# Patient Record
Sex: Female | Born: 1970 | Race: White | Hispanic: No | Marital: Married | Smoking: Never smoker
Health system: Southern US, Community
[De-identification: ages and names within clinical notes are randomized; demographics above are authoritative.]

## PROBLEM LIST (undated history)

## (undated) DIAGNOSIS — J45909 Unspecified asthma, uncomplicated: Secondary | ICD-10-CM

## (undated) DIAGNOSIS — K2100 Gastro-esophageal reflux disease with esophagitis, without bleeding: Secondary | ICD-10-CM

## (undated) DIAGNOSIS — R7303 Prediabetes: Secondary | ICD-10-CM

## (undated) DIAGNOSIS — K589 Irritable bowel syndrome without diarrhea: Secondary | ICD-10-CM

## (undated) DIAGNOSIS — I10 Essential (primary) hypertension: Secondary | ICD-10-CM

## (undated) DIAGNOSIS — T7840XA Allergy, unspecified, initial encounter: Secondary | ICD-10-CM

## (undated) DIAGNOSIS — F419 Anxiety disorder, unspecified: Secondary | ICD-10-CM

## (undated) DIAGNOSIS — D649 Anemia, unspecified: Secondary | ICD-10-CM

## (undated) DIAGNOSIS — M549 Dorsalgia, unspecified: Secondary | ICD-10-CM

## (undated) DIAGNOSIS — Q799 Congenital malformation of musculoskeletal system, unspecified: Secondary | ICD-10-CM

## (undated) DIAGNOSIS — G8929 Other chronic pain: Secondary | ICD-10-CM

## (undated) DIAGNOSIS — G43909 Migraine, unspecified, not intractable, without status migrainosus: Secondary | ICD-10-CM

## (undated) DIAGNOSIS — L900 Lichen sclerosus et atrophicus: Secondary | ICD-10-CM

## (undated) DIAGNOSIS — M1990 Unspecified osteoarthritis, unspecified site: Secondary | ICD-10-CM

## (undated) HISTORY — DX: Congenital malformation of musculoskeletal system, unspecified: Q79.9

## (undated) HISTORY — PX: OTHER SURGICAL HISTORY: SHX169

## (undated) HISTORY — DX: Essential (primary) hypertension: I10

## (undated) HISTORY — PX: COLONOSCOPY: SHX174

## (undated) HISTORY — PX: LEG SURGERY: SHX1003

## (undated) HISTORY — DX: Migraine, unspecified, not intractable, without status migrainosus: G43.909

## (undated) HISTORY — PX: ESOPHAGOGASTRODUODENOSCOPY: SHX1529

## (undated) HISTORY — DX: Unspecified asthma, uncomplicated: J45.909

## (undated) HISTORY — PX: NASAL SINUS SURGERY: SHX719

## (undated) HISTORY — DX: Gastro-esophageal reflux disease with esophagitis: K21.0

## (undated) HISTORY — PX: ABLATION: SHX5711

## (undated) HISTORY — DX: Lichen sclerosus et atrophicus: L90.0

## (undated) HISTORY — PX: ENDOMETRIAL ABLATION: SHX621

## (undated) HISTORY — DX: Gastro-esophageal reflux disease with esophagitis, without bleeding: K21.00

---

## 2003-04-15 ENCOUNTER — Encounter: Payer: Self-pay | Admitting: Neurological Surgery

## 2003-04-15 ENCOUNTER — Ambulatory Visit (HOSPITAL_COMMUNITY): Admission: RE | Admit: 2003-04-15 | Discharge: 2003-04-16 | Payer: Self-pay | Admitting: Neurological Surgery

## 2003-08-12 ENCOUNTER — Observation Stay (HOSPITAL_COMMUNITY): Admission: RE | Admit: 2003-08-12 | Discharge: 2003-08-12 | Payer: Self-pay | Admitting: Neurological Surgery

## 2003-10-07 ENCOUNTER — Inpatient Hospital Stay (HOSPITAL_COMMUNITY): Admission: RE | Admit: 2003-10-07 | Discharge: 2003-10-09 | Payer: Self-pay | Admitting: Neurological Surgery

## 2003-10-10 ENCOUNTER — Emergency Department (HOSPITAL_COMMUNITY): Admission: EM | Admit: 2003-10-10 | Discharge: 2003-10-11 | Payer: Self-pay | Admitting: *Deleted

## 2003-10-11 ENCOUNTER — Observation Stay (HOSPITAL_COMMUNITY): Admission: AD | Admit: 2003-10-11 | Discharge: 2003-10-12 | Payer: Self-pay | Admitting: Neurological Surgery

## 2004-03-07 ENCOUNTER — Encounter: Admission: RE | Admit: 2004-03-07 | Discharge: 2004-03-07 | Payer: Self-pay | Admitting: Neurological Surgery

## 2004-06-26 ENCOUNTER — Encounter: Admission: RE | Admit: 2004-06-26 | Discharge: 2004-06-26 | Payer: Self-pay | Admitting: Neurological Surgery

## 2004-10-10 ENCOUNTER — Encounter: Admission: RE | Admit: 2004-10-10 | Discharge: 2004-10-10 | Payer: Self-pay | Admitting: Neurological Surgery

## 2004-11-10 ENCOUNTER — Ambulatory Visit: Payer: Self-pay | Admitting: Internal Medicine

## 2004-12-26 ENCOUNTER — Ambulatory Visit: Payer: Self-pay

## 2005-12-07 ENCOUNTER — Ambulatory Visit: Payer: Self-pay | Admitting: Internal Medicine

## 2006-11-18 ENCOUNTER — Ambulatory Visit: Payer: Self-pay | Admitting: Unknown Physician Specialty

## 2007-06-14 ENCOUNTER — Emergency Department: Payer: Self-pay | Admitting: Emergency Medicine

## 2007-08-19 ENCOUNTER — Ambulatory Visit: Payer: Self-pay | Admitting: Internal Medicine

## 2008-11-12 ENCOUNTER — Ambulatory Visit: Payer: Self-pay | Admitting: Internal Medicine

## 2009-11-29 ENCOUNTER — Ambulatory Visit: Payer: Self-pay | Admitting: Unknown Physician Specialty

## 2010-12-19 ENCOUNTER — Ambulatory Visit: Payer: Self-pay | Admitting: Unknown Physician Specialty

## 2012-02-28 ENCOUNTER — Ambulatory Visit: Payer: Self-pay | Admitting: Internal Medicine

## 2012-07-14 ENCOUNTER — Ambulatory Visit: Payer: Self-pay | Admitting: Unknown Physician Specialty

## 2013-03-02 ENCOUNTER — Ambulatory Visit: Payer: Self-pay | Admitting: Internal Medicine

## 2014-03-12 ENCOUNTER — Ambulatory Visit: Payer: Self-pay | Admitting: Internal Medicine

## 2015-02-10 ENCOUNTER — Other Ambulatory Visit: Payer: Self-pay | Admitting: Internal Medicine

## 2015-02-10 DIAGNOSIS — R200 Anesthesia of skin: Secondary | ICD-10-CM

## 2015-02-10 DIAGNOSIS — M542 Cervicalgia: Secondary | ICD-10-CM

## 2015-02-11 ENCOUNTER — Ambulatory Visit
Admission: RE | Admit: 2015-02-11 | Discharge: 2015-02-11 | Disposition: A | Discharge status: Home / Self Care | Payer: BLUE CROSS/BLUE SHIELD | Source: Ambulatory Visit | Attending: Internal Medicine | Admitting: Internal Medicine

## 2015-02-11 DIAGNOSIS — M5031 Other cervical disc degeneration,  high cervical region: Secondary | ICD-10-CM | POA: Diagnosis not present

## 2015-02-11 DIAGNOSIS — M5382 Other specified dorsopathies, cervical region: Secondary | ICD-10-CM | POA: Diagnosis not present

## 2015-02-11 DIAGNOSIS — M542 Cervicalgia: Secondary | ICD-10-CM

## 2015-02-11 DIAGNOSIS — R200 Anesthesia of skin: Secondary | ICD-10-CM

## 2015-02-11 DIAGNOSIS — M4802 Spinal stenosis, cervical region: Secondary | ICD-10-CM | POA: Insufficient documentation

## 2015-02-11 DIAGNOSIS — M5021 Other cervical disc displacement,  high cervical region: Secondary | ICD-10-CM | POA: Insufficient documentation

## 2015-02-11 DIAGNOSIS — M47892 Other spondylosis, cervical region: Secondary | ICD-10-CM | POA: Diagnosis not present

## 2015-02-15 ENCOUNTER — Ambulatory Visit: Payer: Self-pay

## 2015-02-18 LAB — HM MAMMOGRAPHY

## 2015-02-18 LAB — HM PAP SMEAR: HM Pap smear: NEGATIVE

## 2016-01-19 ENCOUNTER — Other Ambulatory Visit: Payer: Self-pay | Admitting: Internal Medicine

## 2016-01-19 DIAGNOSIS — Z1231 Encounter for screening mammogram for malignant neoplasm of breast: Secondary | ICD-10-CM

## 2016-02-02 ENCOUNTER — Ambulatory Visit
Admission: RE | Admit: 2016-02-02 | Discharge: 2016-02-02 | Disposition: A | Discharge status: Home / Self Care | Payer: Managed Care, Other (non HMO) | Source: Ambulatory Visit | Attending: Internal Medicine | Admitting: Internal Medicine

## 2016-02-02 ENCOUNTER — Other Ambulatory Visit: Payer: Self-pay | Admitting: Internal Medicine

## 2016-02-02 DIAGNOSIS — Z1231 Encounter for screening mammogram for malignant neoplasm of breast: Secondary | ICD-10-CM | POA: Diagnosis present

## 2016-02-03 ENCOUNTER — Other Ambulatory Visit: Payer: Self-pay | Admitting: Internal Medicine

## 2016-02-03 ENCOUNTER — Ambulatory Visit
Admission: RE | Admit: 2016-02-03 | Discharge: 2016-02-03 | Disposition: A | Discharge status: Home / Self Care | Payer: Managed Care, Other (non HMO) | Source: Ambulatory Visit | Attending: Internal Medicine | Admitting: Internal Medicine

## 2016-02-03 DIAGNOSIS — R928 Other abnormal and inconclusive findings on diagnostic imaging of breast: Secondary | ICD-10-CM

## 2017-02-08 ENCOUNTER — Other Ambulatory Visit: Payer: Self-pay | Admitting: Internal Medicine

## 2017-02-08 DIAGNOSIS — Z1231 Encounter for screening mammogram for malignant neoplasm of breast: Secondary | ICD-10-CM

## 2017-02-20 ENCOUNTER — Ambulatory Visit
Admission: RE | Admit: 2017-02-20 | Discharge: 2017-02-20 | Disposition: A | Discharge status: Home / Self Care | Payer: Managed Care, Other (non HMO) | Source: Ambulatory Visit | Attending: Internal Medicine | Admitting: Internal Medicine

## 2017-02-20 DIAGNOSIS — Z1231 Encounter for screening mammogram for malignant neoplasm of breast: Secondary | ICD-10-CM | POA: Insufficient documentation

## 2017-06-28 IMAGING — MR MR CERVICAL SPINE W/O CM
5 series · 42 of 48 positions shown · non-contrast
Comparison: None.

CLINICAL DATA: Pain extending down right arm into fingers. Symptoms
for 2 weeks.

EXAM:
MRI CERVICAL SPINE WITHOUT CONTRAST
TECHNIQUE: Multiplanar, multisequence MR imaging of the cervical spine was
performed. No intravenous contrast was administered.

[Series 2: T2 · sagittal · 3.0mm · 0.70mm/px · 6 of 13 slices shown (1 of 2)]
[im 1/13]
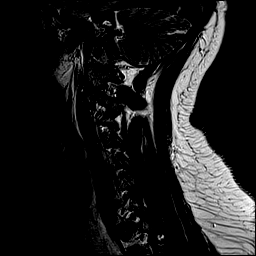
[im 3/13]
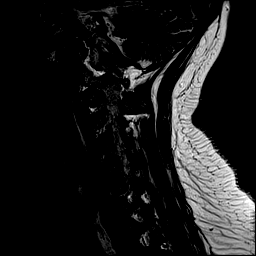
[im 5/13]
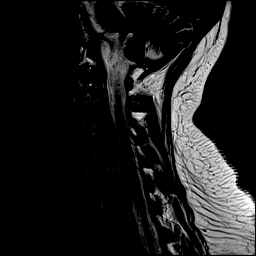
[im 8/13]
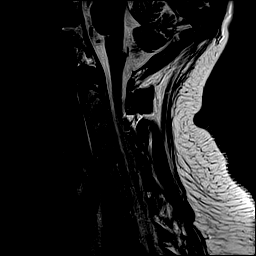
[im 10/13]
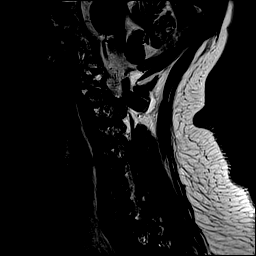
[im 13/13]
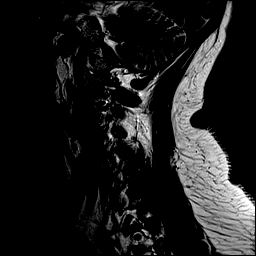

[Series 3: T1 · sagittal · 3.0mm · 0.70mm/px · 7 of 13 slices shown]
[im 1/13]
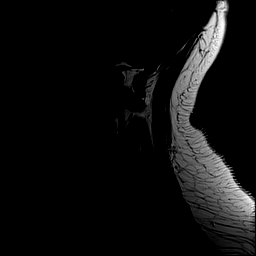
[im 3/13]
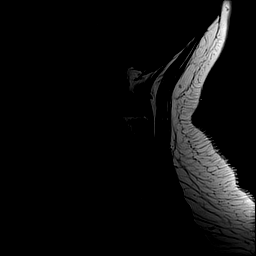
[im 5/13]
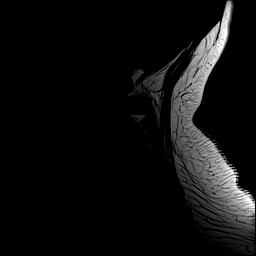
[im 7/13]
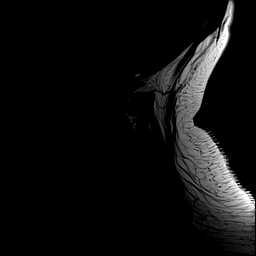
[im 9/13]
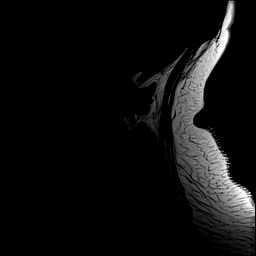
[im 11/13]
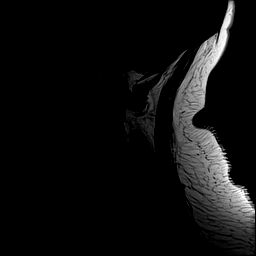
[im 13/13]
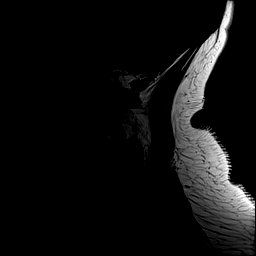

[Series 4: STIR · sagittal · 3.0mm · 0.70mm/px · 7 of 13 slices shown]
[im 1/13]
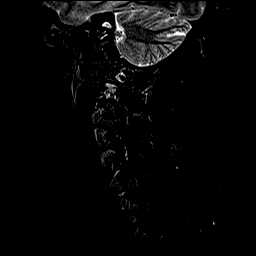
[im 3/13]
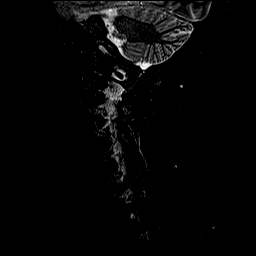
[im 5/13]
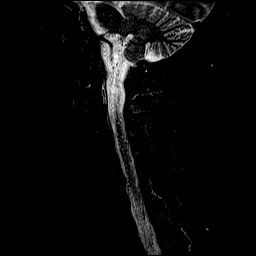
[im 7/13]
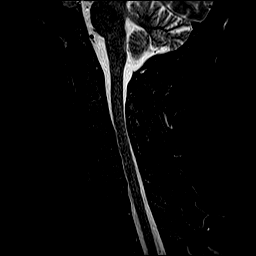
[im 9/13]
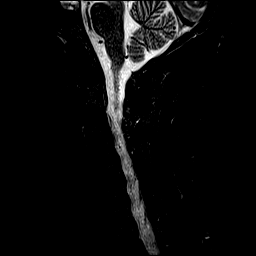
[im 11/13]
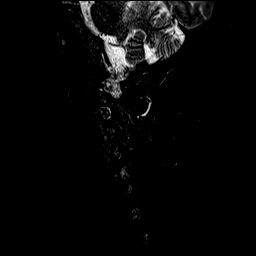
[im 13/13]
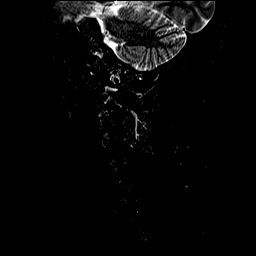

[Series 7: T2 · axial · 3.0mm · 0.70mm/px · z∈[-115,-17]mm · 14 of 27 slices shown (2 of 2)]
[im 1/27]
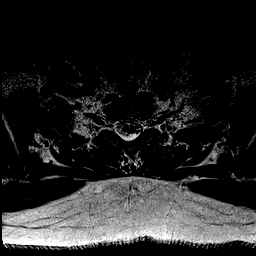
[im 3/27]
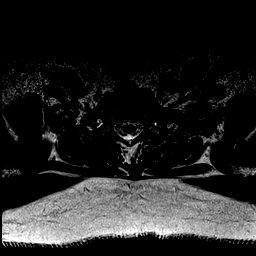
[im 5/27]
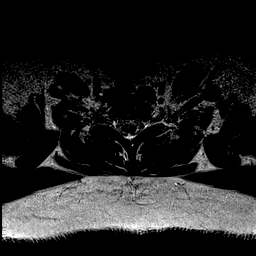
[im 7/27]
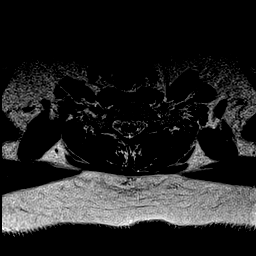
[im 9/27]
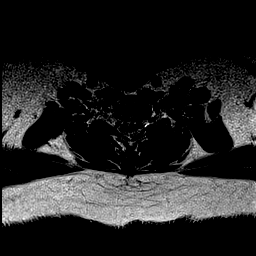
[im 11/27]
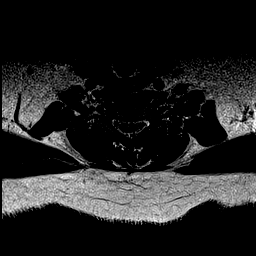
[im 13/27]
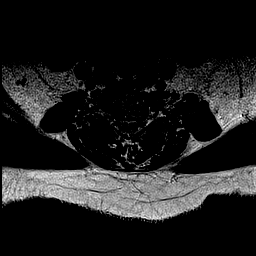
[im 15/27]
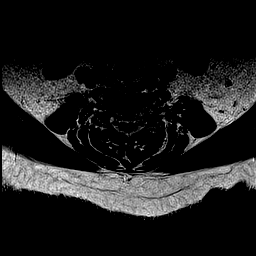
[im 17/27]
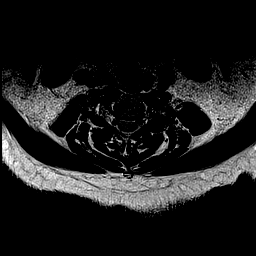
[im 19/27]
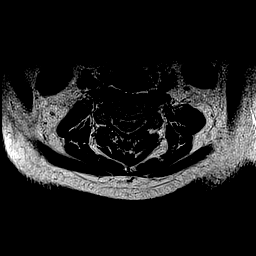
[im 21/27]
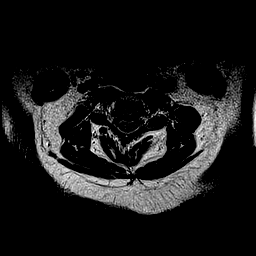
[im 23/27]
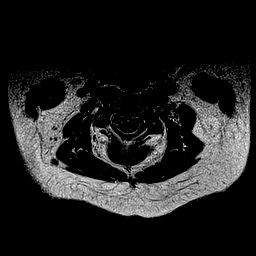
[im 25/27]
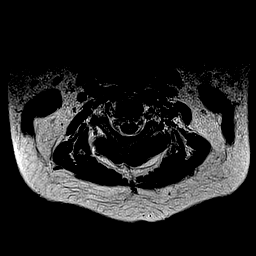
[im 27/27]
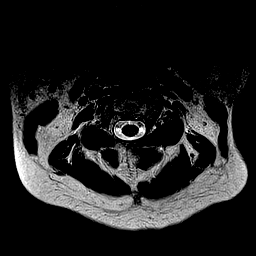

[Series 101: mpgr ax_fil_1 · axial · 3.0mm · 0.35mm/px · z∈[-115,-17]mm · 8 of 27 slices shown]
[im 1/27]
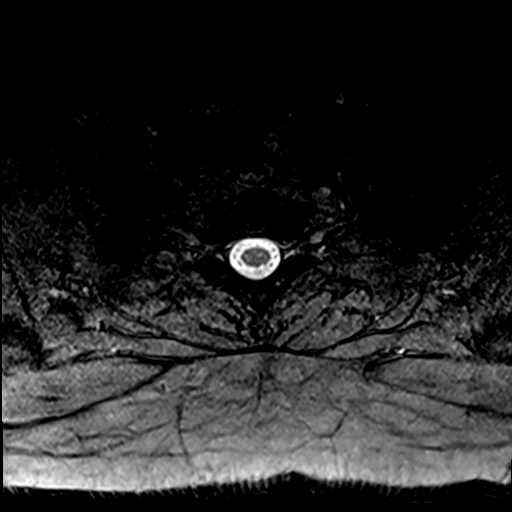
[im 5/27]
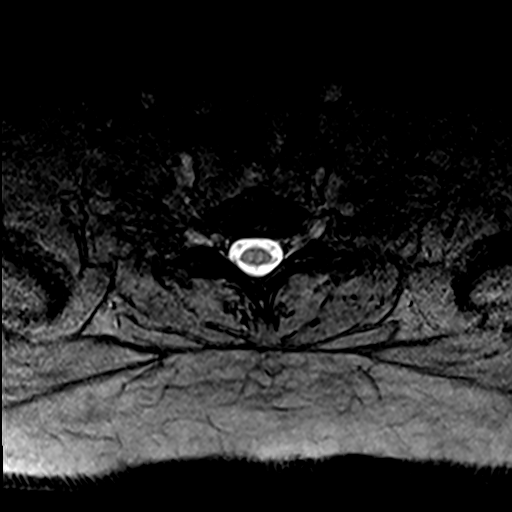
[im 9/27]
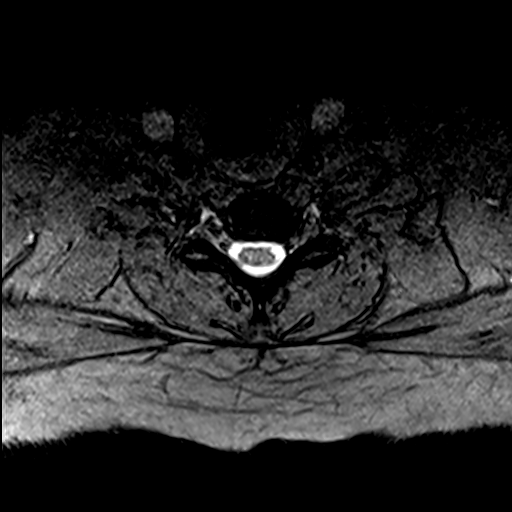
[im 13/27]
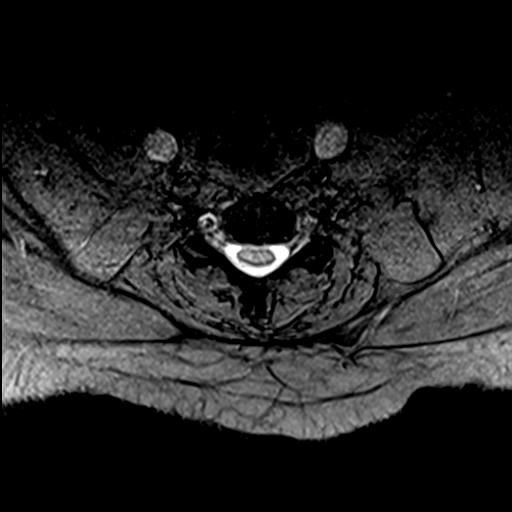
[im 15/27]
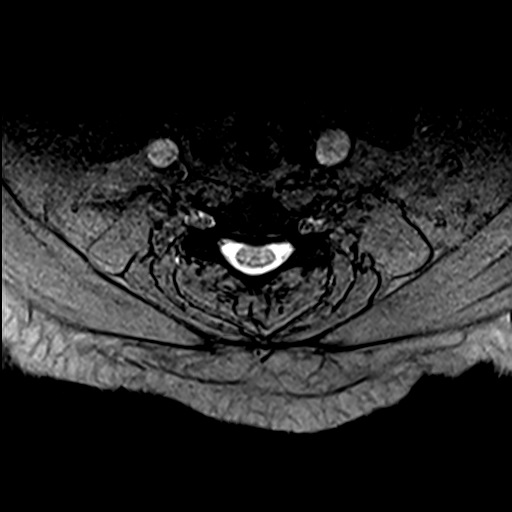
[im 19/27]
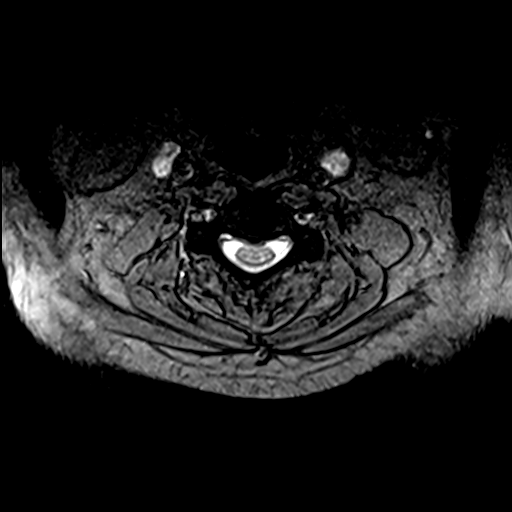
[im 23/27]
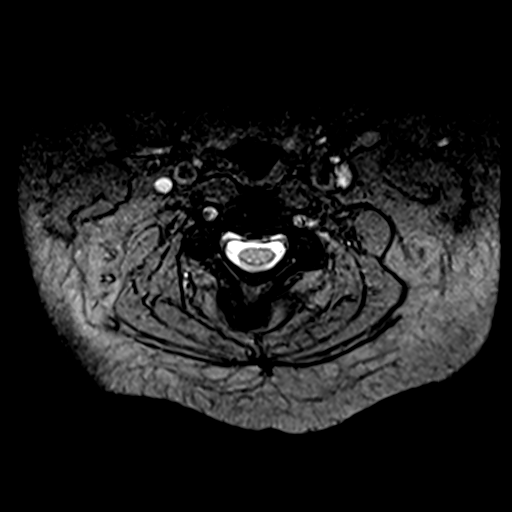
[im 27/27]
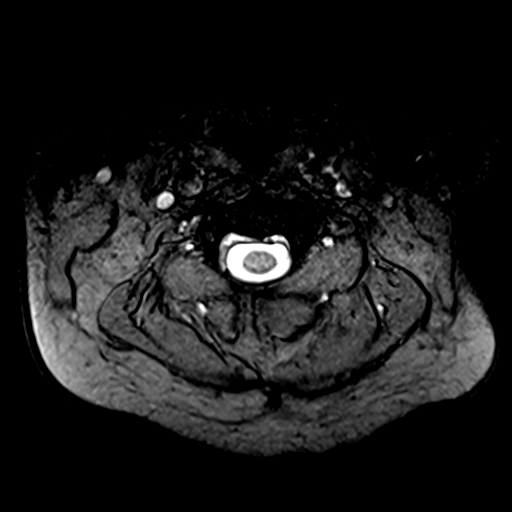

[42 of 48 positions shown; findings below may reference images not displayed]

FINDINGS: The craniocervical junction appears unremarkable. Loss of the normal
cervical lordosis, which can be associated with muscle spasm.

No vertebral subluxation is observed. No significant vertebral
marrow edema is identified. No significant abnormal spinal cord
signal is observed. Despite efforts by the technologist and patient,
motion artifact is present on today's exam and could not be
eliminated. This reduces exam sensitivity and specificity.
Additional findings at individual levels are as follows:

C2-3:  Unremarkable.

C3-4: Moderate central narrowing of the thecal sac due to central
disc protrusion

C4-5: Borderline central narrowing of the thecal sac due to mild
disc bulge.

C5-6: Borderline central narrowing of the thecal sac due to disc
bulge.

C6-7:  No impingement.  Mild uncinate spurring.

C7-T1:  Unremarkable.
IMPRESSION: 1. Cervical spondylosis and degenerative disc disease, with a
central disc protrusion causing moderate central narrowing of the
thecal sac at C3-4.
2. Loss of the normal cervical lordosis, which can be associated
with muscle spasm.

## 2017-07-11 ENCOUNTER — Encounter: Payer: Self-pay | Admitting: Obstetrics and Gynecology

## 2017-07-11 ENCOUNTER — Ambulatory Visit (INDEPENDENT_AMBULATORY_CARE_PROVIDER_SITE_OTHER): Payer: 59 | Admitting: Obstetrics and Gynecology

## 2017-07-11 VITALS — BP 130/90 | HR 79 | Ht 67.0 in | Wt 270.0 lb

## 2017-07-11 DIAGNOSIS — L309 Dermatitis, unspecified: Secondary | ICD-10-CM | POA: Diagnosis not present

## 2017-07-11 DIAGNOSIS — Z1231 Encounter for screening mammogram for malignant neoplasm of breast: Secondary | ICD-10-CM | POA: Diagnosis not present

## 2017-07-11 DIAGNOSIS — Z01419 Encounter for gynecological examination (general) (routine) without abnormal findings: Secondary | ICD-10-CM

## 2017-07-11 DIAGNOSIS — Z1239 Encounter for other screening for malignant neoplasm of breast: Secondary | ICD-10-CM

## 2017-07-11 DIAGNOSIS — L900 Lichen sclerosus et atrophicus: Secondary | ICD-10-CM

## 2017-07-11 MED ORDER — ALCORTIN A 1-2-1 % EX GEL: CUTANEOUS | 0 refills | Status: DC

## 2017-07-11 NOTE — Progress Notes (Addendum)
PCP:  Idelle Crouch, MD   Chief Complaint  Patient presents with  . Gynecologic Exam     HPI:      Ms. Kimberly Rios is a 46 y.o. No obstetric history on file. who LMP was No LMP recorded. Patient is postmenopausal., presents today for her annual examination.  Her menses are Q3 months now (used to be Q2-4 wks),  lasting 4 days.  Dysmenorrhea mild, occurring first 1-2 days of flow. She does not have intermenstrual bleeding. Pt with hx of hypermenorrhea, followed by Dr. Rayford Halsted, 2011. S/P endometrial ablation with some relief. Pt still having frequent menses last yr with neg u/s. Sx resolved this yr.  Sex activity: single partner, contraception - vasectomy.  Last Pap: February 18, 2015  Results were: no abnormalities /neg HPV DNA  Hx of STDs: none  Last mammogram: February 20, 2017  Results were: normal--routine follow-up in 12 months There is no FH of breast cancer. There is no FH of ovarian cancer. The patient does do self-breast exams. She gets colonoscopies Q5 yrs due to [place] of colon cancer in her mom. Her mom also just diagnosed with pancreatic cancer, but pt doesn't qualify for cancer genetic testing per NCCN guidelines.  Tobacco use: The patient denies current or previous tobacco use. Alcohol use: none No drug use.  Exercise: not active  She does get adequate calcium but not Vitamin D in her diet.  Labs with PCP.  Hx of LS, treats with clobetasol prn. Doesn't need Rx RF. Also has fungal looking derm under bilat breasts and ing areas with underwear. Treated last yr with clotrimazole without relief. Sx improved if uses clobetasol.   Hx of LTO cyst last yr that resolved with f/u u/s.   Past Medical History:  Diagnosis Date  . Asthma   . Hypertension   . Lichen sclerosus   . Migraine   . Musculoskeletal deformity   . Reflux esophagitis     Past Surgical History:  Procedure Laterality Date  . ABLATION    . back surgery     . ENDOMETRIAL ABLATION    . LEG SURGERY      . NASAL SINUS SURGERY      Family History  Problem Relation Age of Onset  . Colon cancer Mother 28  . Diabetes Mother   . Kidney cancer Mother 76  . Hypertension Mother   . Thyroid cancer Mother   . Pancreatic cancer Mother 55    Social History   Socioeconomic History  . Marital status: Married    Spouse name: Not on file  . Number of children: Not on file  . Years of education: Not on file  . Highest education level: Not on file  Social Needs  . Financial resource strain: Not on file  . Food insecurity - worry: Not on file  . Food insecurity - inability: Not on file  . Transportation needs - medical: Not on file  . Transportation needs - non-medical: Not on file  Occupational History  . Not on file  Tobacco Use  . Smoking status: Never Smoker  . Smokeless tobacco: Never Used  Substance and Sexual Activity  . Alcohol use: No    Frequency: Never  . Drug use: No  . Sexual activity: Yes  Other Topics Concern  . Not on file  Social History Narrative  . Not on file    Current Meds  Medication Sig  . budesonide (RHINOCORT AQUA) 32 MCG/ACT nasal spray Place  into the nose.  Marland Kitchen buPROPion (WELLBUTRIN XL) 150 MG 24 hr tablet Take by mouth.  . carvedilol (COREG) 12.5 MG tablet Take by mouth.  . cetirizine-pseudoephedrine (ZYRTEC-D ALLERGY & CONGESTION) 5-120 MG tablet Take by mouth.  Marland Kitchen HYDROcodone-acetaminophen (NORCO) 10-325 MG tablet Take by mouth.  . montelukast (SINGULAIR) 10 MG tablet Take by mouth.  . nabumetone (RELAFEN) 750 MG tablet Take by mouth.  Marland Kitchen omeprazole (PRILOSEC) 20 MG capsule Take by mouth.     ROS:  Review of Systems  Constitutional: Negative for fatigue, fever and unexpected weight change.  Respiratory: Negative for cough, shortness of breath and wheezing.   Cardiovascular: Negative for chest pain, palpitations and leg swelling.  Gastrointestinal: Negative for blood in stool, constipation, diarrhea, nausea and vomiting.  Endocrine: Negative for  cold intolerance, heat intolerance and polyuria.  Genitourinary: Negative for dyspareunia, dysuria, flank pain, frequency, genital sores, hematuria, menstrual problem, pelvic pain, urgency, vaginal bleeding, vaginal discharge and vaginal pain.  Musculoskeletal: Negative for back pain, joint swelling and myalgias.  Skin: Negative for rash.  Neurological: Negative for dizziness, syncope, light-headedness, numbness and headaches.  Hematological: Negative for adenopathy.  Psychiatric/Behavioral: Negative for agitation, confusion, sleep disturbance and suicidal ideas. The patient is not nervous/anxious.      Objective: BP 130/90   Pulse 79   Ht 5\' 7"  (1.702 m)   Wt 270 lb (122.5 kg)   BMI 42.29 kg/m    Physical Exam  Constitutional: She is oriented to person, place, and time. She appears well-developed and well-nourished.  Genitourinary: Vagina normal and uterus normal. There is no rash or tenderness on the right labia. There is no rash or tenderness on the left labia. No erythema or tenderness in the vagina. No vaginal discharge found. Right adnexum does not display mass and does not display tenderness. Left adnexum does not display mass and does not display tenderness. Cervix does not exhibit motion tenderness or polyp. Uterus is not enlarged or tender.  Neck: Normal range of motion. No thyromegaly present.  Cardiovascular: Normal rate, regular rhythm and normal heart sounds.  No murmur heard. Pulmonary/Chest: Effort normal and breath sounds normal. Right breast exhibits no mass, no nipple discharge, no skin change and no tenderness. Left breast exhibits no mass, no nipple discharge, no skin change and no tenderness.  Abdominal: Soft. There is no tenderness. There is no guarding.  Musculoskeletal: Normal range of motion.  Neurological: She is alert and oriented to person, place, and time. No cranial nerve deficit.  Skin: Lesion and rash noted. There is erythema.  RED SCALEY AREAS UNDER  BOTH BREASTS AND BILAT ING AREAS  Psychiatric: She has a normal mood and affect. Her behavior is normal.  Vitals reviewed.   Assessment/Plan: Encounter for annual routine gynecological examination  Screening for breast cancer - Pt does with PCP.  Dermatitis - Under breasts/ing areas. Looks fungal. Rx alcortin. Keep dry. F/u prn. - Plan: Iodoquinol-HC-Aloe Polysacch (ALCORTIN A) 6-8-1 % GEL  Lichen sclerosus - Will call for clobetasol RF prn. No lesions today.  Meds ordered this encounter  Medications  . Iodoquinol-HC-Aloe Polysacch (ALCORTIN A) 1-2-1 % GEL    Sig: AAA BID prn sx    Dispense:  48 g    Refill:  0             GYN counsel breast self exam, mammography screening, peri-menopause, calcium and Vit D.     F/U  Return in about 1 year (around 07/11/2018).  Concepcion Gillott B. Makinsey Pepitone, PA-C 07/11/2017  9:59 AM

## 2017-07-11 NOTE — Patient Instructions (Signed)
I value your feedback and entrusting us with your care. If you get a [facility] patient survey, I would appreciate you taking the time to let us know about your experience today. Thank you! 

## 2017-12-19 ENCOUNTER — Encounter: Payer: Self-pay | Admitting: *Deleted

## 2017-12-20 ENCOUNTER — Encounter: Payer: Self-pay | Admitting: *Deleted

## 2017-12-20 ENCOUNTER — Other Ambulatory Visit: Payer: Self-pay

## 2017-12-20 ENCOUNTER — Ambulatory Visit: Payer: 59 | Admitting: Anesthesiology

## 2017-12-20 ENCOUNTER — Ambulatory Visit
Admission: RE | Admit: 2017-12-20 | Discharge: 2017-12-20 | Disposition: A | Discharge status: Home / Self Care | Payer: 59 | Source: Ambulatory Visit | Attending: Unknown Physician Specialty | Admitting: Unknown Physician Specialty

## 2017-12-20 ENCOUNTER — Encounter
Admission: RE | Disposition: A | Discharge status: Home / Self Care | Payer: Self-pay | Source: Ambulatory Visit | Attending: Unknown Physician Specialty

## 2017-12-20 DIAGNOSIS — D120 Benign neoplasm of cecum: Secondary | ICD-10-CM | POA: Insufficient documentation

## 2017-12-20 DIAGNOSIS — Z1211 Encounter for screening for malignant neoplasm of colon: Secondary | ICD-10-CM | POA: Insufficient documentation

## 2017-12-20 DIAGNOSIS — Z79891 Long term (current) use of opiate analgesic: Secondary | ICD-10-CM | POA: Diagnosis not present

## 2017-12-20 DIAGNOSIS — K210 Gastro-esophageal reflux disease with esophagitis: Secondary | ICD-10-CM | POA: Insufficient documentation

## 2017-12-20 DIAGNOSIS — K635 Polyp of colon: Secondary | ICD-10-CM | POA: Insufficient documentation

## 2017-12-20 DIAGNOSIS — Z800 Family history of malignant neoplasm of digestive organs: Secondary | ICD-10-CM | POA: Insufficient documentation

## 2017-12-20 DIAGNOSIS — I10 Essential (primary) hypertension: Secondary | ICD-10-CM | POA: Insufficient documentation

## 2017-12-20 DIAGNOSIS — Z6841 Body Mass Index (BMI) 40.0 and over, adult: Secondary | ICD-10-CM | POA: Insufficient documentation

## 2017-12-20 DIAGNOSIS — Z791 Long term (current) use of non-steroidal anti-inflammatories (NSAID): Secondary | ICD-10-CM | POA: Insufficient documentation

## 2017-12-20 DIAGNOSIS — K317 Polyp of stomach and duodenum: Secondary | ICD-10-CM | POA: Diagnosis not present

## 2017-12-20 DIAGNOSIS — D123 Benign neoplasm of transverse colon: Secondary | ICD-10-CM | POA: Insufficient documentation

## 2017-12-20 DIAGNOSIS — J45909 Unspecified asthma, uncomplicated: Secondary | ICD-10-CM | POA: Diagnosis not present

## 2017-12-20 DIAGNOSIS — Z7951 Long term (current) use of inhaled steroids: Secondary | ICD-10-CM | POA: Insufficient documentation

## 2017-12-20 DIAGNOSIS — Z79899 Other long term (current) drug therapy: Secondary | ICD-10-CM | POA: Diagnosis not present

## 2017-12-20 DIAGNOSIS — K621 Rectal polyp: Secondary | ICD-10-CM | POA: Insufficient documentation

## 2017-12-20 HISTORY — DX: Anemia, unspecified: D64.9

## 2017-12-20 HISTORY — PX: COLONOSCOPY WITH PROPOFOL: SHX5780

## 2017-12-20 HISTORY — DX: Irritable bowel syndrome, unspecified: K58.9

## 2017-12-20 HISTORY — DX: Other chronic pain: G89.29

## 2017-12-20 HISTORY — DX: Unspecified osteoarthritis, unspecified site: M19.90

## 2017-12-20 HISTORY — DX: Prediabetes: R73.03

## 2017-12-20 HISTORY — DX: Allergy, unspecified, initial encounter: T78.40XA

## 2017-12-20 HISTORY — DX: Anxiety disorder, unspecified: F41.9

## 2017-12-20 HISTORY — PX: ESOPHAGOGASTRODUODENOSCOPY (EGD) WITH PROPOFOL: SHX5813

## 2017-12-20 HISTORY — DX: Dorsalgia, unspecified: M54.9

## 2017-12-20 SURGERY — ESOPHAGOGASTRODUODENOSCOPY (EGD) WITH PROPOFOL
Anesthesia: General

## 2017-12-20 MED ORDER — SODIUM CHLORIDE 0.9 % IV SOLN: INTRAVENOUS | Status: DC

## 2017-12-20 MED ORDER — DEXMEDETOMIDINE HCL 200 MCG/2ML IV SOLN
INTRAVENOUS | Status: DC | PRN
  Administered 2017-12-20: 12 ug via INTRAVENOUS

## 2017-12-20 MED ORDER — LIDOCAINE HCL (CARDIAC) PF 100 MG/5ML IV SOSY
PREFILLED_SYRINGE | INTRAVENOUS | Status: DC | PRN
  Administered 2017-12-20: 30 mg via INTRAVENOUS

## 2017-12-20 MED ORDER — PROPOFOL 500 MG/50ML IV EMUL
INTRAVENOUS | Status: DC | PRN
  Administered 2017-12-20: 125 ug/kg/min via INTRAVENOUS

## 2017-12-20 MED ORDER — PROPOFOL 10 MG/ML IV BOLUS
INTRAVENOUS | Status: DC | PRN
  Administered 2017-12-20 (×2): 50 mg via INTRAVENOUS
  Administered 2017-12-20: 30 mg via INTRAVENOUS

## 2017-12-20 MED ORDER — GLYCOPYRROLATE 0.2 MG/ML IJ SOLN
INTRAMUSCULAR | Status: DC | PRN
  Administered 2017-12-20: 0.2 mg via INTRAVENOUS

## 2017-12-20 MED ORDER — PROPOFOL 500 MG/50ML IV EMUL
INTRAVENOUS | Status: AC
  Filled 2017-12-20: qty 100

## 2017-12-20 MED ORDER — SODIUM CHLORIDE 0.9 % IV SOLN
INTRAVENOUS | Status: DC
  Administered 2017-12-20: 10:00:00 via INTRAVENOUS

## 2017-12-20 NOTE — H&P (Signed)
Primary Care Physician:  Idelle Crouch, MD Primary Gastroenterologist:  Dr. Vira Agar  Pre-Procedure History & Physical: HPI:  Kimberly Rios is a 47 y.o. female is here for an endoscopy and colonoscopy.  Done for [facility] in mother and GERD.   Past Medical History:  Diagnosis Date  . Allergic   . Anemia   . Anxiety   . Arthritis   . Asthma   . Chronic back pain   . Hypertension   . IBS (irritable bowel syndrome)   . Lichen sclerosus   . Migraine   . Musculoskeletal deformity   . Pre-diabetes   . Reflux esophagitis     Past Surgical History:  Procedure Laterality Date  . ABLATION    . back surgery     . COLONOSCOPY    . ENDOMETRIAL ABLATION    . ESOPHAGOGASTRODUODENOSCOPY    . LEG SURGERY    . NASAL SINUS SURGERY      Prior to Admission medications   Medication Sig Start Date End Date Taking? Authorizing Provider  carvedilol (COREG) 12.5 MG tablet Take by mouth. 07/03/17 07/03/18 Yes [provider]  budesonide (RHINOCORT AQUA) 32 MCG/ACT nasal spray Place into the nose. 03/29/17   [provider]  buPROPion (WELLBUTRIN XL) 150 MG 24 hr tablet Take by mouth. 06/25/17 06/25/18  [provider]  cetirizine-pseudoephedrine (ZYRTEC-D ALLERGY & CONGESTION) 5-120 MG tablet Take by mouth. 06/25/17   [provider]  HYDROcodone-acetaminophen [facility]) 10-325 MG tablet Take by mouth. 06/25/17   [provider]  Iodoquinol-HC-Aloe Polysacch (ALCORTIN A) 1-2-1 % GEL AAA BID prn sx 25/9/56   Copland, Alicia B, PA-C  montelukast (SINGULAIR) 10 MG tablet Take by mouth. 12/12/16   [provider]  nabumetone (RELAFEN) 750 MG tablet Take by mouth. 03/29/17   [provider]  omeprazole (PRILOSEC) 20 MG capsule Take by mouth. 06/25/17   [provider]  vitamin B-12 (CYANOCOBALAMIN) 1000 MCG tablet Take by mouth.    [provider]    Allergies as of 07/18/2017  . (Not on File)    Family History   Problem Relation Age of Onset  . Colon cancer Mother 52  . Diabetes Mother   . Kidney cancer Mother 15  . Hypertension Mother   . Thyroid cancer Mother   . Pancreatic cancer Mother 79    Social History   Socioeconomic History  . Marital status: Married    Spouse name: Not on file  . Number of children: Not on file  . Years of education: Not on file  . Highest education level: Not on file  Occupational History  . Not on file  Social Needs  . Financial resource strain: Not on file  . Food insecurity:    Worry: Not on file    Inability: Not on file  . Transportation needs:    Medical: Not on file    Non-medical: Not on file  Tobacco Use  . Smoking status: Never Smoker  . Smokeless tobacco: Never Used  Substance and Sexual Activity  . Alcohol use: No    Frequency: Never  . Drug use: No  . Sexual activity: Yes  Lifestyle  . Physical activity:    Days per week: Not on file    Minutes per session: Not on file  . Stress: Not on file  Relationships  . Social connections:    Talks on phone: Not on file    Gets together: Not on file  Attends religious service: Not on file    Active member of club or organization: Not on file    Attends meetings of clubs or organizations: Not on file    Relationship status: Not on file  . Intimate partner violence:    Fear of current or ex partner: Not on file    Emotionally abused: Not on file    Physically abused: Not on file    Forced sexual activity: Not on file  Other Topics Concern  . Not on file  Social History Narrative  . Not on file    Review of Systems: See HPI, otherwise negative ROS  Physical Exam: BP (!) 140/98   Pulse 74   Temp (!) 97.4 F (36.3 C) (Tympanic)   Resp 18   Ht 5\' 7"  (1.702 m)   Wt 117.9 kg (260 lb)   SpO2 99%   BMI 40.72 kg/m  General:   Alert,  pleasant and cooperative in NAD Head:  Normocephalic and atraumatic. Neck:  Supple; no masses or thyromegaly. Lungs:  Clear throughout to  auscultation.    Heart:  Regular rate and rhythm. Abdomen:  Soft, nontender and nondistended. Normal bowel sounds, without guarding, and without rebound.   Neurologic:  Alert and  oriented x4;  grossly normal neurologically.  Impression/Plan: Kimberly Rios is here for an endoscopy and colonoscopy to be performed for colon cancer screening due to [facility] in mother and GERD  Risks, benefits, limitations, and alternatives regarding  endoscopy and colonoscopy have been reviewed with the patient.  Questions have been answered.  All parties agreeable.   Gaylyn Cheers, MD  12/20/2017, 10:46 AM

## 2017-12-20 NOTE — Op Note (Signed)
[facility] Patient Name: Kimberly Rios Procedure Date: 12/20/2017 10:46 AM MRN: [id number] Account #: [id number] Date of Birth: January 18, 1971 Admit Type: Outpatient Age: 47 Room: [facility] ENDO ROOM 3 Gender: Female Note Status: Finalized Procedure:            Colonoscopy Indications:          Screening in patient at increased risk: Family history                        of 1st-degree relative with colorectal cancer Providers:            Manya Silvas, MD Referring MD:         Leonie Douglas. Doy Hutching, MD (Referring MD) Medicines:            Propofol per Anesthesia Complications:        No immediate complications. Procedure:            Pre-Anesthesia Assessment:                       - After reviewing the risks and benefits, the patient                        was deemed in satisfactory condition to undergo the                        procedure.                       After obtaining informed consent, the colonoscope was                        passed under direct vision. Throughout the procedure,                        the patient's blood pressure, pulse, and oxygen                        saturations were monitored continuously. The                        Colonoscope was introduced through the anus and                        advanced to the the cecum, identified by appendiceal                        orifice and ileocecal valve. The colonoscopy was                        performed without difficulty. The patient tolerated the                        procedure well. The quality of the bowel preparation                        was good. Findings:      Three sessile polyps were found in the cecum. The polyps were diminutive       in size. These polyps were removed with a jumbo cold forceps. Resection       and retrieval were complete.  A diminutive polyp was found in the proximal ascending colon. The polyp       was sessile. The polyp was removed with a jumbo  cold forceps. Resection       and retrieval were complete.      A small polyp was found in the transverse colon. The polyp was sessile.       The polyp was removed with a hot snare. Resection and retrieval were       complete. To prevent bleeding after the polypectomy, one hemostatic clip       was successfully placed. There was no bleeding during, or at the end, of       the procedure.      A diminutive polyp was found in the rectum. The polyp was sessile. The       polyp was removed with a jumbo cold forceps. Resection and retrieval       were complete. Impression:           - Three diminutive polyps in the cecum, removed with a                        jumbo cold forceps. Resected and retrieved.                       - One diminutive polyp in the proximal ascending colon,                        removed with a jumbo cold forceps. Resected and                        retrieved.                       - One small polyp in the transverse colon, removed with                        a hot snare. Resected and retrieved. Clip was placed.                       - One diminutive polyp in the rectum, removed with a                        jumbo cold forceps. Resected and retrieved. Recommendation:       - Await pathology results. Manya Silvas, MD 12/20/2017 11:46:40 AM This report has been signed electronically. Number of Addenda: 0 Note Initiated On: 12/20/2017 10:46 AM Scope Withdrawal Time: 0 hours 11 minutes 39 seconds  Total Procedure Duration: 0 hours 21 minutes 13 seconds       [facility]

## 2017-12-20 NOTE — Anesthesia Post-op Follow-up Note (Signed)
Anesthesia QCDR form completed.        

## 2017-12-20 NOTE — Transfer of Care (Deleted)
Immediate Anesthesia Transfer of Care Note  Patient: Kimberly Rios  Procedure(s) Performed: ESOPHAGOGASTRODUODENOSCOPY (EGD) WITH PROPOFOL (N/A ) COLONOSCOPY WITH PROPOFOL (N/A )  Patient Location: PACU and Endoscopy Unit  Anesthesia Type:General  Level of Consciousness: awake, alert  and oriented  Airway & Oxygen Therapy: Patient Spontanous Breathing and Patient connected to nasal cannula oxygen  Post-op Assessment: Report given to RN and Post -op Vital signs reviewed and stable  Post vital signs: Reviewed and stable  Last Vitals:  Vitals Value Taken Time  BP 140/98 12/20/2017 10:14 AM  Temp 36.3 C 12/20/2017 10:14 AM  Pulse 74 12/20/2017 10:14 AM  Resp 18 12/20/2017 10:14 AM  SpO2 99 % 12/20/2017 10:14 AM    Last Pain:  Vitals:   12/20/17 1014  TempSrc: Tympanic  PainSc: 0-No pain         Complications: No apparent anesthesia complications

## 2017-12-20 NOTE — Transfer of Care (Signed)
Immediate Anesthesia Transfer of Care Note  Patient: Kimberly Rios  Procedure(s) Performed: ESOPHAGOGASTRODUODENOSCOPY (EGD) WITH PROPOFOL (N/A ) COLONOSCOPY WITH PROPOFOL (N/A )  Patient Location: PACU and Endoscopy Unit  Anesthesia Type:General  Level of Consciousness: awake, alert  and oriented  Airway & Oxygen Therapy: Patient Spontanous Breathing and Patient connected to face mask oxygen  Post-op Assessment: Report given to RN and Post -op Vital signs reviewed and stable  Post vital signs: Reviewed and stable  Last Vitals:  Vitals Value Taken Time  BP 107/60 12/20/2017 11:48 AM  Temp 36.1 C 12/20/2017 11:48 AM  Pulse 79 12/20/2017 11:49 AM  Resp 12 12/20/2017 11:49 AM  SpO2 97 % 12/20/2017 11:49 AM  Vitals shown include unvalidated device data.  Last Pain:  Vitals:   12/20/17 1148  TempSrc: Tympanic  PainSc: 0-No pain         Complications: No apparent anesthesia complications

## 2017-12-20 NOTE — Anesthesia Preprocedure Evaluation (Signed)
Anesthesia Evaluation  Patient identified by MRN, date of birth, ID band Patient awake    Reviewed: Allergy & Precautions, H&P , NPO status , Patient's Chart, lab work & pertinent test results, reviewed documented beta blocker date and time   History of Anesthesia Complications Negative for: history of anesthetic complications  Airway Mallampati: II  TM Distance: >3 FB Neck ROM: full    Dental  (+) Caps, Dental Advidsory Given, Teeth Intact   Pulmonary neg shortness of breath, asthma , neg sleep apnea, neg COPD, neg recent URI,           Cardiovascular Exercise Tolerance: Good hypertension, (-) angina(-) CAD, (-) Past MI, (-) Cardiac Stents and (-) CABG (-) dysrhythmias (-) Valvular Problems/Murmurs     Neuro/Psych PSYCHIATRIC DISORDERS Anxiety negative neurological ROS     GI/Hepatic negative GI ROS, Neg liver ROS, GERD  ,  Endo/Other  neg diabetesMorbid obesity  Renal/GU negative Renal ROS  negative genitourinary   Musculoskeletal   Abdominal   Peds  Hematology negative hematology ROS (+)   Anesthesia Other Findings Past Medical History: No date: Allergic No date: Anemia No date: Anxiety No date: Arthritis No date: Asthma No date: Chronic back pain No date: Hypertension No date: IBS (irritable bowel syndrome) No date: Lichen sclerosus No date: Migraine No date: Musculoskeletal deformity No date: Pre-diabetes No date: Reflux esophagitis   Reproductive/Obstetrics negative OB ROS                             Anesthesia Physical Anesthesia Plan  ASA: III  Anesthesia Plan: General   Post-op Pain Management:    Induction: Intravenous  PONV Risk Score and Plan: 3 and Propofol infusion  Airway Management Planned: Natural Airway and Nasal Cannula  Additional Equipment:   Intra-op Plan:   Post-operative Plan:   Informed Consent: I have reviewed the patients History and  Physical, chart, labs and discussed the procedure including the risks, benefits and alternatives for the proposed anesthesia with the patient or authorized representative who has indicated his/her understanding and acceptance.   Dental Advisory Given  Plan Discussed with: Anesthesiologist, CRNA and Surgeon  Anesthesia Plan Comments:         Anesthesia Quick Evaluation

## 2017-12-20 NOTE — Op Note (Signed)
[facility] Patient Name: Kimberly Rios Procedure Date: 12/20/2017 10:47 AM MRN: [id number] Account #: [id number] Date of Birth: June 11, 1971 Admit Type: Outpatient Age: 47 Room: [facility] ENDO ROOM 3 Gender: Female Note Status: Finalized Procedure:            Upper GI endoscopy Indications:          Heartburn Providers:            Manya Silvas, MD Referring MD:         Leonie Douglas. Doy Hutching, MD (Referring MD) Medicines:            Propofol per Anesthesia Complications:        No immediate complications. Procedure:            Pre-Anesthesia Assessment:                       - After reviewing the risks and benefits, the patient                        was deemed in satisfactory condition to undergo the                        procedure.                       After obtaining informed consent, the endoscope was                        passed under direct vision. Throughout the procedure,                        the patient's blood pressure, pulse, and oxygen                        saturations were monitored continuously. The Endoscope                        was introduced through the mouth, and advanced to the                        second part of duodenum. The upper GI endoscopy was                        accomplished without difficulty. The patient tolerated                        the procedure well. Findings:      The examined esophagus was normal. 40cm.      Multiple large sessile polyps with no bleeding and no stigmata of recent       bleeding were found in the gastric body. The polyp was removed with a       hot snare. Resection was complete, but the polyp tissue was only       partially retrieved using a Roth net. Two hemostatic clips were       successfully placed. Largest polyps removed and recovered with basket.      The gastric antrum was normal.      The examined duodenum was normal. Impression:           - Normal esophagus.  - Multiple gastric polyps. Complete resection. Partial  retrieval. Clips were placed.                       - Normal antrum.                       - Normal examined duodenum. Recommendation:       - Await pathology results. Manya Silvas, MD 12/20/2017 11:19:41 AM This report has been signed electronically. Number of Addenda: 0 Note Initiated On: 12/20/2017 10:47 AM      [facility]

## 2017-12-21 NOTE — Anesthesia Postprocedure Evaluation (Signed)
Anesthesia Post Note  Patient: Brandi Tomlinson  Procedure(s) Performed: ESOPHAGOGASTRODUODENOSCOPY (EGD) WITH PROPOFOL (N/A ) COLONOSCOPY WITH PROPOFOL (N/A )  Patient location during evaluation: Endoscopy Anesthesia Type: General Level of consciousness: awake and alert Pain management: pain level controlled Vital Signs Assessment: post-procedure vital signs reviewed and stable Respiratory status: spontaneous breathing, nonlabored ventilation, respiratory function stable and patient connected to nasal cannula oxygen Cardiovascular status: blood pressure returned to baseline and stable Postop Assessment: no apparent nausea or vomiting Anesthetic complications: no     Last Vitals:  Vitals:   12/20/17 1148 12/20/17 1158  BP: 107/60 117/88  Pulse: 86 74  Resp: 17 19  Temp: (!) 36.1 C   SpO2: 96% 94%    Last Pain:  Vitals:   12/21/17 1115  TempSrc:   PainSc: 2                  Martha Clan

## 2017-12-23 ENCOUNTER — Encounter: Payer: Self-pay | Admitting: Unknown Physician Specialty

## 2017-12-24 LAB — SURGICAL PATHOLOGY

## 2018-03-24 ENCOUNTER — Other Ambulatory Visit: Payer: Self-pay | Admitting: Internal Medicine

## 2018-03-24 DIAGNOSIS — Z1231 Encounter for screening mammogram for malignant neoplasm of breast: Secondary | ICD-10-CM

## 2018-04-04 ENCOUNTER — Ambulatory Visit
Admission: RE | Admit: 2018-04-04 | Discharge: 2018-04-04 | Disposition: A | Discharge status: Home / Self Care | Payer: 59 | Source: Ambulatory Visit | Attending: Internal Medicine | Admitting: Internal Medicine

## 2018-04-04 DIAGNOSIS — Z1231 Encounter for screening mammogram for malignant neoplasm of breast: Secondary | ICD-10-CM | POA: Insufficient documentation

## 2019-03-17 ENCOUNTER — Other Ambulatory Visit: Payer: Self-pay | Admitting: Internal Medicine

## 2019-03-17 DIAGNOSIS — Z1231 Encounter for screening mammogram for malignant neoplasm of breast: Secondary | ICD-10-CM

## 2019-04-23 ENCOUNTER — Ambulatory Visit
Admission: RE | Admit: 2019-04-23 | Discharge: 2019-04-23 | Disposition: A | Discharge status: Home / Self Care | Payer: Managed Care, Other (non HMO) | Source: Ambulatory Visit | Attending: Internal Medicine | Admitting: Internal Medicine

## 2019-04-23 DIAGNOSIS — Z1231 Encounter for screening mammogram for malignant neoplasm of breast: Secondary | ICD-10-CM | POA: Insufficient documentation

## 2019-04-28 ENCOUNTER — Other Ambulatory Visit: Payer: Self-pay | Admitting: Internal Medicine

## 2019-04-28 DIAGNOSIS — R928 Other abnormal and inconclusive findings on diagnostic imaging of breast: Secondary | ICD-10-CM

## 2019-04-28 DIAGNOSIS — N6489 Other specified disorders of breast: Secondary | ICD-10-CM

## 2019-05-01 ENCOUNTER — Ambulatory Visit
Admission: RE | Admit: 2019-05-01 | Discharge: 2019-05-01 | Disposition: A | Discharge status: Home / Self Care | Payer: Managed Care, Other (non HMO) | Source: Ambulatory Visit | Attending: Internal Medicine | Admitting: Internal Medicine

## 2019-05-01 DIAGNOSIS — N6489 Other specified disorders of breast: Secondary | ICD-10-CM | POA: Insufficient documentation

## 2019-05-01 DIAGNOSIS — R928 Other abnormal and inconclusive findings on diagnostic imaging of breast: Secondary | ICD-10-CM | POA: Insufficient documentation

## 2019-08-10 ENCOUNTER — Other Ambulatory Visit: Payer: Self-pay | Admitting: Physician Assistant

## 2019-08-10 ENCOUNTER — Other Ambulatory Visit: Payer: Self-pay

## 2019-08-10 ENCOUNTER — Ambulatory Visit
Admission: RE | Admit: 2019-08-10 | Discharge: 2019-08-10 | Disposition: A | Discharge status: Home / Self Care | Payer: Managed Care, Other (non HMO) | Source: Ambulatory Visit | Attending: Physician Assistant | Admitting: Physician Assistant

## 2019-08-10 DIAGNOSIS — R1011 Right upper quadrant pain: Secondary | ICD-10-CM | POA: Diagnosis present

## 2019-08-10 DIAGNOSIS — R1013 Epigastric pain: Secondary | ICD-10-CM | POA: Insufficient documentation

## 2019-08-20 ENCOUNTER — Other Ambulatory Visit: Payer: Self-pay | Admitting: Internal Medicine

## 2019-08-20 ENCOUNTER — Ambulatory Visit
Admission: RE | Admit: 2019-08-20 | Discharge: 2019-08-20 | Disposition: A | Discharge status: Home / Self Care | Payer: Managed Care, Other (non HMO) | Source: Ambulatory Visit | Attending: Internal Medicine | Admitting: Internal Medicine

## 2019-08-20 ENCOUNTER — Other Ambulatory Visit (HOSPITAL_COMMUNITY): Payer: Self-pay | Admitting: Internal Medicine

## 2019-08-20 ENCOUNTER — Other Ambulatory Visit: Payer: Self-pay

## 2019-08-20 DIAGNOSIS — R0781 Pleurodynia: Secondary | ICD-10-CM

## 2019-08-20 MED ORDER — IOHEXOL 350 MG/ML SOLN
75.0000 mL | Freq: Once | INTRAVENOUS | Status: AC | PRN
  Administered 2019-08-20: 75 mL via INTRAVENOUS

## 2020-04-18 ENCOUNTER — Other Ambulatory Visit: Payer: Self-pay | Admitting: Internal Medicine

## 2020-04-18 DIAGNOSIS — Z1231 Encounter for screening mammogram for malignant neoplasm of breast: Secondary | ICD-10-CM

## 2020-05-11 ENCOUNTER — Other Ambulatory Visit: Payer: Self-pay

## 2020-05-11 ENCOUNTER — Ambulatory Visit
Admission: RE | Admit: 2020-05-11 | Discharge: 2020-05-11 | Disposition: A | Discharge status: Home / Self Care | Payer: Managed Care, Other (non HMO) | Source: Ambulatory Visit | Attending: Internal Medicine | Admitting: Internal Medicine

## 2020-05-11 DIAGNOSIS — Z1231 Encounter for screening mammogram for malignant neoplasm of breast: Secondary | ICD-10-CM | POA: Insufficient documentation

## 2020-05-17 ENCOUNTER — Other Ambulatory Visit: Payer: Self-pay | Admitting: Internal Medicine

## 2020-05-17 DIAGNOSIS — R928 Other abnormal and inconclusive findings on diagnostic imaging of breast: Secondary | ICD-10-CM

## 2020-05-17 DIAGNOSIS — N6310 Unspecified lump in the right breast, unspecified quadrant: Secondary | ICD-10-CM

## 2020-05-24 ENCOUNTER — Ambulatory Visit
Admission: RE | Admit: 2020-05-24 | Discharge: 2020-05-24 | Disposition: A | Discharge status: Home / Self Care | Payer: Managed Care, Other (non HMO) | Source: Ambulatory Visit | Attending: Internal Medicine | Admitting: Internal Medicine

## 2020-05-24 ENCOUNTER — Other Ambulatory Visit: Payer: Self-pay

## 2020-05-24 DIAGNOSIS — R928 Other abnormal and inconclusive findings on diagnostic imaging of breast: Secondary | ICD-10-CM

## 2020-05-24 DIAGNOSIS — N6310 Unspecified lump in the right breast, unspecified quadrant: Secondary | ICD-10-CM

## 2020-06-06 DIAGNOSIS — L900 Lichen sclerosus et atrophicus: Secondary | ICD-10-CM | POA: Insufficient documentation

## 2020-06-06 DIAGNOSIS — Z800 Family history of malignant neoplasm of digestive organs: Secondary | ICD-10-CM | POA: Insufficient documentation

## 2020-06-06 NOTE — Progress Notes (Signed)
PCP:  Idelle Crouch, MD   Chief Complaint  Patient presents with   Gynecologic Exam     HPI:      Ms. Kimberly Rios is a 49 y.o. No obstetric history on file. who LMP was No LMP recorded. Patient is postmenopausal., presents today for her annual examination.  Her menses are absent now due to menopause/endometrial ablation. Pt with hx of hypermenorrhea, followed by Dr. Rayford Halsted, 2011. S/P endometrial ablation with some relief but still had frequent menses for several yrs.   Sex activity: single partner, contraception - vasectomy.  Last Pap: February 18, 2015  Results were: no abnormalities /neg HPV DNA  Hx of STDs: none  Last mammogram: 05/24/20 Results were: normal with RT breast cyst--routine follow-up in 12 months There is now a FH of breast cancer in her mom as well as pancreatic cancer and colon cancer. Her mom was neg for full panel cancer genetic testing. There is no FH of ovarian cancer. The patient does do self-breast exams.    Tobacco use: The patient denies current or previous tobacco use. Alcohol use: none No drug use.  Exercise: not active  She does get adequate calcium and Vitamin D in her diet.  Colonoscpy: 2019 with polyps; repeat due after 3 yrs with Dr. Vira Agar; used to be colonoscopies Q5 yrs due to [place] of colon cancer in her mom  Labs with PCP.  Hx of LS, treats with clobetasol prn. Doesn't need Rx RF--getting RF by derm.  Past Medical History:  Diagnosis Date   Allergic    Anemia    Anxiety    Arthritis    Asthma    Chronic back pain    Hypertension    IBS (irritable bowel syndrome)    Lichen sclerosus    Migraine    Musculoskeletal deformity    Pre-diabetes    Reflux esophagitis     Past Surgical History:  Procedure Laterality Date   ABLATION     back surgery      COLONOSCOPY     COLONOSCOPY WITH PROPOFOL N/A 12/20/2017   Procedure: COLONOSCOPY WITH PROPOFOL;  Surgeon: Manya Silvas, MD;  Location: [facility] ENDOSCOPY;   Service: Endoscopy;  Laterality: N/A;   ENDOMETRIAL ABLATION     ESOPHAGOGASTRODUODENOSCOPY     ESOPHAGOGASTRODUODENOSCOPY (EGD) WITH PROPOFOL N/A 12/20/2017   Procedure: ESOPHAGOGASTRODUODENOSCOPY (EGD) WITH PROPOFOL;  Surgeon: Manya Silvas, MD;  Location: [facility] ENDOSCOPY;  Service: Endoscopy;  Laterality: N/A;   LEG SURGERY     NASAL SINUS SURGERY      Family History  Problem Relation Age of Onset   Colon cancer Mother 35   Diabetes Mother    Kidney cancer Mother 78   Hypertension Mother    Thyroid cancer Mother    Pancreatic cancer Mother 65   Breast cancer Mother 82    Social History   Socioeconomic History   Marital status: Married    Spouse name: Not on file   Number of children: Not on file   Years of education: Not on file   Highest education level: Not on file  Occupational History   Not on file  Tobacco Use   Smoking status: Never Smoker   Smokeless tobacco: Never Used  Vaping Use   Vaping Use: Never used  Substance and Sexual Activity   Alcohol use: No   Drug use: No   Sexual activity: Yes    Birth control/protection: Post-menopausal  Other Topics Concern   Not on  file  Social History Narrative   Not on file   Social Determinants of Health   Financial Resource Strain:    Difficulty of Paying Living Expenses: Not on file  Food Insecurity:    Worried About Charity fundraiser in the Last Year: Not on file   [employer] of Food in the Last Year: Not on file  Transportation Needs:    Lack of Transportation (Medical): Not on file   Lack of Transportation (Non-Medical): Not on file  Physical Activity:    Days of Exercise per Week: Not on file   Minutes of Exercise per Session: Not on file  Stress:    Feeling of Stress : Not on file  Social Connections:    Frequency of Communication with Friends and Family: Not on file   Frequency of Social Gatherings with Friends and Family: Not on file   Attends Religious  Services: Not on file   Active Member of Clubs or Organizations: Not on file   Attends Archivist Meetings: Not on file   Marital Status: Not on file  Intimate Partner Violence:    Fear of Current or Ex-Partner: Not on file   Emotionally Abused: Not on file   Physically Abused: Not on file   Sexually Abused: Not on file    Current Meds  Medication Sig   albuterol (PROVENTIL) (2.5 MG/3ML) 0.083% nebulizer solution Inhale into the lungs.   albuterol (VENTOLIN HFA) 108 (90 Base) MCG/ACT inhaler Inhale into the lungs.   budesonide (RHINOCORT AQUA) 32 MCG/ACT nasal spray Place into the nose.   carvedilol (COREG) 25 MG tablet Take 25 mg by mouth 2 (two) times daily.   cetirizine-pseudoephedrine (ZYRTEC-D ALLERGY & CONGESTION) 5-120 MG tablet Take by mouth.   clobetasol (TEMOVATE) 0.05 % external solution APP TO PSORIASIS ON SCALP D HS PRN   EPINEPHrine 0.3 mg/0.3 mL IJ SOAJ injection SMARTSIG:1 Syringe(s) IM Daily PRN   fluconazole (DIFLUCAN) 200 MG tablet TAKE 1 TABLET BY MOUTH 1 TIME WEEKLY   hydrochlorothiazide (HYDRODIURIL) 25 MG tablet Take by mouth.   HYDROcodone-acetaminophen (NORCO) 10-325 MG tablet Take by mouth.   montelukast (SINGULAIR) 10 MG tablet Take by mouth.   omeprazole (PRILOSEC) 20 MG capsule Take by mouth.   vitamin B-12 (CYANOCOBALAMIN) 1000 MCG tablet Take by mouth.     ROS:  Review of Systems  Constitutional: Negative for fatigue, fever and unexpected weight change.  Respiratory: Negative for cough, shortness of breath and wheezing.   Cardiovascular: Negative for chest pain, palpitations and leg swelling.  Gastrointestinal: Negative for blood in stool, constipation, diarrhea, nausea and vomiting.  Endocrine: Negative for cold intolerance, heat intolerance and polyuria.  Genitourinary: Negative for dyspareunia, dysuria, flank pain, frequency, genital sores, hematuria, menstrual problem, pelvic pain, urgency, vaginal bleeding, vaginal  discharge and vaginal pain.  Musculoskeletal: Negative for back pain, joint swelling and myalgias.  Skin: Negative for rash.  Neurological: Negative for dizziness, syncope, light-headedness, numbness and headaches.  Hematological: Negative for adenopathy.  Psychiatric/Behavioral: Negative for agitation, confusion, sleep disturbance and suicidal ideas. The patient is not nervous/anxious.      Objective: BP 120/90    Ht 5\' 7"  (1.702 m)    Wt 261 lb (118.4 kg)    BMI 40.88 kg/m    Physical Exam Constitutional:      Appearance: She is well-developed.  Genitourinary:     Vagina, cervix, uterus, right adnexa and left adnexa normal.     Vulval rash present.  No vulval lesion or tenderness noted.        No vaginal discharge, erythema or tenderness.     No cervical polyp.     Uterus is not enlarged or tender.     No right or left adnexal mass present.     Right adnexa not tender.     Left adnexa not tender.     Genitourinary Comments: RED AREAS HAVE ERYTHEMA, MILD HYPERTROPHY/SCALE  Neck:     Thyroid: No thyromegaly.  Cardiovascular:     Rate and Rhythm: Normal rate and regular rhythm.     Heart sounds: Normal heart sounds. No murmur heard.   Pulmonary:     Effort: Pulmonary effort is normal.     Breath sounds: Normal breath sounds.  Chest:     Breasts:        Right: No mass, nipple discharge, skin change or tenderness.        Left: No mass, nipple discharge, skin change or tenderness.  Abdominal:     Palpations: Abdomen is soft.     Tenderness: There is no abdominal tenderness. There is no guarding.  Musculoskeletal:        General: Normal range of motion.     Cervical back: Normal range of motion.  Neurological:     General: No focal deficit present.     Mental Status: She is alert and oriented to person, place, and time.     Cranial Nerves: No cranial nerve deficit.  Skin:    General: Skin is warm and dry.  Psychiatric:        Mood and Affect: Mood normal.         Behavior: Behavior normal.        Thought Content: Thought content normal.        Judgment: Judgment normal.  Vitals reviewed.     Assessment/Plan: Encounter for annual routine gynecological examination  Cervical cancer screening - Plan: Cytology - PAP  Screening for HPV (human papillomavirus) - Plan: Cytology - PAP  Encounter for screening mammogram for malignant neoplasm of breast; pt current on mammo  Family history of pancreatic cancer--mom is cancer gene panel neg. Cont to follow FH for updated testing in the future  Lichen sclerosus--managed with clobetasol. Has Rx RF with derm. Suggested daily tx for about a wk to calm down sx. No evid of SCC.          GYN counsel breast self exam, mammography screening, calcium and Vit D.     F/U  Return in about 1 year (around 06/07/2021).  Anel Creighton B. Kalonji Zurawski, PA-C 06/07/2020 10:42 AM

## 2020-06-07 ENCOUNTER — Other Ambulatory Visit: Payer: Self-pay

## 2020-06-07 ENCOUNTER — Other Ambulatory Visit (HOSPITAL_COMMUNITY)
Admission: RE | Admit: 2020-06-07 | Discharge: 2020-06-07 | Disposition: A | Discharge status: Home / Self Care | Payer: Managed Care, Other (non HMO) | Source: Ambulatory Visit | Attending: Obstetrics and Gynecology | Admitting: Obstetrics and Gynecology

## 2020-06-07 ENCOUNTER — Ambulatory Visit (INDEPENDENT_AMBULATORY_CARE_PROVIDER_SITE_OTHER): Payer: Managed Care, Other (non HMO) | Admitting: Obstetrics and Gynecology

## 2020-06-07 ENCOUNTER — Encounter: Payer: Self-pay | Admitting: Obstetrics and Gynecology

## 2020-06-07 VITALS — BP 120/90 | Ht 67.0 in | Wt 261.0 lb

## 2020-06-07 DIAGNOSIS — Z1231 Encounter for screening mammogram for malignant neoplasm of breast: Secondary | ICD-10-CM | POA: Diagnosis not present

## 2020-06-07 DIAGNOSIS — Z800 Family history of malignant neoplasm of digestive organs: Secondary | ICD-10-CM

## 2020-06-07 DIAGNOSIS — L900 Lichen sclerosus et atrophicus: Secondary | ICD-10-CM

## 2020-06-07 DIAGNOSIS — Z124 Encounter for screening for malignant neoplasm of cervix: Secondary | ICD-10-CM | POA: Diagnosis not present

## 2020-06-07 DIAGNOSIS — Z01419 Encounter for gynecological examination (general) (routine) without abnormal findings: Secondary | ICD-10-CM | POA: Diagnosis not present

## 2020-06-07 DIAGNOSIS — Z1151 Encounter for screening for human papillomavirus (HPV): Secondary | ICD-10-CM | POA: Diagnosis not present

## 2020-06-07 NOTE — Patient Instructions (Signed)
I value your feedback and entrusting us with your care. If you get a [facility] patient survey, I would appreciate you taking the time to let us know about your experience today. Thank you!  As of July 16, 2019, your lab results will be released to your MyChart immediately, before I even have a chance to see them. Please give me time to review them and contact you if there are any abnormalities. Thank you for your patience.  

## 2020-06-09 LAB — CYTOLOGY - PAP
Comment: NEGATIVE
Diagnosis: NEGATIVE
High risk HPV: NEGATIVE

## 2021-07-14 ENCOUNTER — Ambulatory Visit: Admission: RE | Admit: 2021-07-14 | Payer: Managed Care, Other (non HMO) | Source: Home / Self Care

## 2021-07-14 ENCOUNTER — Encounter: Admission: RE | Payer: Self-pay | Source: Home / Self Care

## 2021-07-14 SURGERY — EGD (ESOPHAGOGASTRODUODENOSCOPY)
Anesthesia: General

## 2021-11-14 ENCOUNTER — Ambulatory Visit
Admission: RE | Admit: 2021-11-14 | Discharge: 2021-11-14 | Disposition: A | Discharge status: Home / Self Care | Payer: Managed Care, Other (non HMO) | Source: Ambulatory Visit | Attending: Internal Medicine | Admitting: Internal Medicine

## 2021-11-14 ENCOUNTER — Other Ambulatory Visit: Payer: Self-pay | Admitting: Internal Medicine

## 2021-11-14 DIAGNOSIS — R1084 Generalized abdominal pain: Secondary | ICD-10-CM

## 2022-03-15 ENCOUNTER — Other Ambulatory Visit: Payer: Self-pay | Admitting: Internal Medicine

## 2022-03-15 DIAGNOSIS — Z1231 Encounter for screening mammogram for malignant neoplasm of breast: Secondary | ICD-10-CM

## 2022-06-25 ENCOUNTER — Other Ambulatory Visit: Payer: Self-pay

## 2022-06-25 MED ORDER — HYDROCODONE-ACETAMINOPHEN 10-325 MG PO TABS
ORAL_TABLET | ORAL | 0 refills | Status: AC
  Filled 2022-06-25: qty 90, 30d supply, fill #0

## 2022-08-23 ENCOUNTER — Other Ambulatory Visit: Payer: Self-pay

## 2022-08-23 MED ORDER — HYDROCODONE-ACETAMINOPHEN 10-325 MG PO TABS
1.0000 | ORAL_TABLET | Freq: Three times a day (TID) | ORAL | 0 refills | Status: AC | PRN
  Filled 2022-08-23: qty 90, 30d supply, fill #0

## 2022-11-26 ENCOUNTER — Other Ambulatory Visit: Payer: Self-pay

## 2022-11-26 MED ORDER — HYDROCODONE-ACETAMINOPHEN 10-325 MG PO TABS
1.0000 | ORAL_TABLET | Freq: Three times a day (TID) | ORAL | 0 refills | Status: DC | PRN
  Filled 2022-11-26: qty 90, 30d supply, fill #0

## 2023-01-23 ENCOUNTER — Other Ambulatory Visit: Payer: Self-pay

## 2023-01-23 MED ORDER — HYDROCODONE-ACETAMINOPHEN 10-325 MG PO TABS
1.0000 | ORAL_TABLET | Freq: Three times a day (TID) | ORAL | 0 refills | Status: AC | PRN
  Filled 2023-01-23: qty 90, 30d supply, fill #0

## 2023-01-25 ENCOUNTER — Other Ambulatory Visit: Payer: Self-pay | Admitting: Family Medicine

## 2023-01-25 ENCOUNTER — Ambulatory Visit
Admission: RE | Admit: 2023-01-25 | Discharge: 2023-01-25 | Disposition: A | Discharge status: Home / Self Care | Payer: Managed Care, Other (non HMO) | Source: Ambulatory Visit | Attending: Family Medicine | Admitting: Family Medicine

## 2023-01-25 DIAGNOSIS — R1013 Epigastric pain: Secondary | ICD-10-CM

## 2023-01-25 DIAGNOSIS — K2100 Gastro-esophageal reflux disease with esophagitis, without bleeding: Secondary | ICD-10-CM

## 2023-01-25 DIAGNOSIS — R110 Nausea: Secondary | ICD-10-CM | POA: Insufficient documentation

## 2023-02-05 ENCOUNTER — Telehealth: Payer: Self-pay

## 2023-02-05 NOTE — Telephone Encounter (Signed)
We received a referral from PCP office Dr. Judithann Sheen office for GERD and epigastric pain. She states that Dr. Judithann Sheen felt like she needed to transfer her care to our office because at her last EGD she had pretty sever Gastritis and [facility] did not do any real treatment or follow up for it. She states that she was Dr. Mechele Collin patient for many years and then we retired she saw the PA once and then she had the scope done by a provider and has never seen that provider since. She would like to transfer care to one of our MD to establish care. She states she is still have pretty sever Gastritis symptoms and is on Pantoprazole 40mg  BID  Carafate. Please advise if you all will accept the transfer.

## 2023-02-11 NOTE — Telephone Encounter (Signed)
Called and left a message for call back  

## 2023-02-12 NOTE — Telephone Encounter (Signed)
Patient states she would for [facility] and will sign a medical release form and fax to [place] so we can get her path report and procedure report faxed to [place]

## 2023-02-20 NOTE — Telephone Encounter (Signed)
Received medical records from patient but the only medical records we received were the medical records from Dr. Mechele Collin in 2019 and these are in epic. We are needing the procedure report from 01/23 that is not in epic fax to [place]. Tried to call patient and left a message for call back

## 2023-02-21 ENCOUNTER — Other Ambulatory Visit: Payer: Self-pay

## 2023-02-21 NOTE — Telephone Encounter (Signed)
Called and left a message for call back for patient  

## 2023-02-21 NOTE — Telephone Encounter (Signed)
Received medical record from 08/24/2021 the colonoscopy and EGD report but did not received the path report. Scanned in the colonoscopy and EGD report. Called and left a detail message for patient to fax over the path report for the procedures. Please advise if you all can review patient chart now or do we still need the path report to review and decried if you all will accept the transfer.

## 2023-02-25 NOTE — Telephone Encounter (Signed)
Informed patient this information and she verbalized understanding

## 2023-02-25 NOTE — Telephone Encounter (Signed)
Called and left a message for call back  

## 2023-03-21 ENCOUNTER — Other Ambulatory Visit: Payer: Self-pay

## 2023-03-21 MED ORDER — COLCHICINE 0.6 MG PO TABS
0.6000 mg | ORAL_TABLET | Freq: Every day | ORAL | 0 refills | Status: DC
  Filled 2023-03-21: qty 30, 30d supply, fill #0

## 2023-03-21 MED ORDER — JANUVIA 100 MG PO TABS
100.0000 mg | ORAL_TABLET | Freq: Every day | ORAL | 11 refills | Status: DC
  Filled 2023-03-21 – 2023-03-22 (×2): qty 30, 30d supply, fill #0
  Filled 2023-07-08: qty 30, 30d supply, fill #1
  Filled 2023-08-04: qty 30, 30d supply, fill #2
  Filled 2023-09-04: qty 30, 30d supply, fill #3
  Filled 2023-11-18: qty 30, 30d supply, fill #4
  Filled 2023-12-25: qty 30, 30d supply, fill #5
  Filled 2024-01-21: qty 30, 30d supply, fill #6
  Filled 2024-02-20: qty 30, 30d supply, fill #7

## 2023-03-21 MED ORDER — AZITHROMYCIN 250 MG PO TABS
ORAL_TABLET | ORAL | 0 refills | Status: AC
  Filled 2023-03-21 – 2023-03-22 (×2): qty 30, 30d supply, fill #0

## 2023-03-21 MED ORDER — FLUCONAZOLE 150 MG PO TABS
150.0000 mg | ORAL_TABLET | Freq: Once | ORAL | 0 refills | Status: AC
  Filled 2023-03-21: qty 1, 1d supply, fill #0

## 2023-03-22 ENCOUNTER — Other Ambulatory Visit: Payer: Self-pay

## 2023-03-25 ENCOUNTER — Other Ambulatory Visit: Payer: Self-pay

## 2023-03-25 MED ORDER — HYDROCODONE-ACETAMINOPHEN 10-325 MG PO TABS
1.0000 | ORAL_TABLET | Freq: Three times a day (TID) | ORAL | 0 refills | Status: AC | PRN
  Filled 2023-03-25: qty 90, 30d supply, fill #0

## 2023-04-07 ENCOUNTER — Other Ambulatory Visit: Payer: Self-pay

## 2023-07-08 ENCOUNTER — Other Ambulatory Visit: Payer: Self-pay

## 2023-08-21 ENCOUNTER — Other Ambulatory Visit: Payer: Self-pay

## 2023-08-21 MED ORDER — HYDROCODONE-ACETAMINOPHEN 10-325 MG PO TABS
1.0000 | ORAL_TABLET | Freq: Three times a day (TID) | ORAL | 0 refills | Status: DC | PRN
  Filled 2023-08-21: qty 90, 30d supply, fill #0

## 2023-08-22 ENCOUNTER — Other Ambulatory Visit: Payer: Self-pay

## 2023-08-22 MED ORDER — PREDNISONE 10 MG PO TABS
ORAL_TABLET | ORAL | 0 refills | Status: DC
  Filled 2023-08-22: qty 12, 6d supply, fill #0

## 2023-08-22 MED ORDER — DOXYCYCLINE HYCLATE 100 MG PO CAPS
100.0000 mg | ORAL_CAPSULE | Freq: Two times a day (BID) | ORAL | 0 refills | Status: AC
  Filled 2023-08-22: qty 14, 7d supply, fill #0

## 2023-09-04 ENCOUNTER — Other Ambulatory Visit: Payer: Self-pay

## 2023-09-05 ENCOUNTER — Other Ambulatory Visit: Payer: Self-pay

## 2023-09-06 ENCOUNTER — Other Ambulatory Visit: Payer: Self-pay

## 2023-09-12 ENCOUNTER — Other Ambulatory Visit: Payer: Self-pay

## 2023-09-12 MED ORDER — EPINEPHRINE 0.3 MG/0.3ML IJ SOAJ
0.3000 mg | Freq: Once | INTRAMUSCULAR | 1 refills | Status: AC | PRN
  Filled 2023-09-12: qty 2, 30d supply, fill #0

## 2023-09-16 ENCOUNTER — Other Ambulatory Visit: Payer: Self-pay

## 2023-09-16 MED ORDER — POLYMYXIN B-TRIMETHOPRIM 10000-0.1 UNIT/ML-% OP SOLN
1.0000 [drp] | OPHTHALMIC | 0 refills | Status: AC
  Filled 2023-09-16: qty 10, 27d supply, fill #0

## 2023-09-19 ENCOUNTER — Other Ambulatory Visit: Payer: Self-pay

## 2023-09-19 MED ORDER — MOUNJARO 2.5 MG/0.5ML ~~LOC~~ SOAJ
2.5000 mg | SUBCUTANEOUS | 1 refills | Status: DC
  Filled 2023-09-19: qty 2, 28d supply, fill #0

## 2023-09-19 MED ORDER — NAPROXEN 500 MG PO TABS
500.0000 mg | ORAL_TABLET | Freq: Two times a day (BID) | ORAL | 0 refills | Status: AC
Start: 2023-09-19 — End: 2023-10-05
  Filled 2023-09-19: qty 20, 10d supply, fill #0

## 2023-09-20 ENCOUNTER — Other Ambulatory Visit: Payer: Self-pay

## 2023-09-30 ENCOUNTER — Other Ambulatory Visit: Payer: Self-pay | Admitting: Internal Medicine

## 2023-09-30 DIAGNOSIS — I10 Essential (primary) hypertension: Secondary | ICD-10-CM

## 2023-09-30 DIAGNOSIS — E7800 Pure hypercholesterolemia, unspecified: Secondary | ICD-10-CM

## 2023-10-08 ENCOUNTER — Ambulatory Visit
Admission: RE | Admit: 2023-10-08 | Discharge: 2023-10-08 | Disposition: A | Discharge status: Home / Self Care | Payer: Self-pay | Source: Ambulatory Visit | Attending: Internal Medicine | Admitting: Internal Medicine

## 2023-10-08 DIAGNOSIS — E7800 Pure hypercholesterolemia, unspecified: Secondary | ICD-10-CM | POA: Insufficient documentation

## 2023-10-08 DIAGNOSIS — I10 Essential (primary) hypertension: Secondary | ICD-10-CM | POA: Insufficient documentation

## 2023-10-16 ENCOUNTER — Other Ambulatory Visit: Payer: Self-pay

## 2023-10-16 MED ORDER — HYDROCHLOROTHIAZIDE 25 MG PO TABS
25.0000 mg | ORAL_TABLET | Freq: Every day | ORAL | 5 refills | Status: AC
  Filled 2023-10-16: qty 30, 30d supply, fill #0
  Filled 2023-12-25: qty 30, 30d supply, fill #1
  Filled 2024-02-20: qty 30, 30d supply, fill #2
  Filled 2024-03-23: qty 30, 30d supply, fill #3
  Filled 2024-05-05 – 2024-05-13 (×2): qty 30, 30d supply, fill #4
  Filled 2024-07-10: qty 30, 30d supply, fill #5

## 2023-10-16 MED ORDER — FREESTYLE LIBRE 3 PLUS SENSOR MISC
5 refills | Status: DC
  Filled 2023-10-16: qty 2, 30d supply, fill #0
  Filled 2023-12-25: qty 2, 30d supply, fill #1
  Filled 2024-01-21: qty 2, 30d supply, fill #2
  Filled 2024-03-23: qty 2, 30d supply, fill #3
  Filled 2024-05-05 – 2024-05-13 (×2): qty 2, 30d supply, fill #4
  Filled 2024-07-10: qty 2, 30d supply, fill #5

## 2023-10-16 MED ORDER — CETIRIZINE-PSEUDOEPHEDRINE ER 5-120 MG PO TB12
1.0000 | ORAL_TABLET | Freq: Two times a day (BID) | ORAL | 5 refills | Status: DC
  Filled 2023-10-16: qty 72, 36d supply, fill #0
  Filled 2023-11-18: qty 72, 36d supply, fill #1
  Filled 2023-12-25: qty 60, 30d supply, fill #2
  Filled 2024-03-23: qty 48, 24d supply, fill #3
  Filled 2024-05-05: qty 48, 24d supply, fill #4
  Filled 2024-05-13: qty 72, 36d supply, fill #4
  Filled 2024-07-10: qty 24, 12d supply, fill #5

## 2023-10-16 MED ORDER — MOUNJARO 5 MG/0.5ML ~~LOC~~ SOAJ
5.0000 mg | SUBCUTANEOUS | 1 refills | Status: DC
Start: 2023-10-16 — End: 2023-11-12
  Filled 2023-10-16: qty 6, 84d supply, fill #0

## 2023-10-17 ENCOUNTER — Other Ambulatory Visit: Payer: Self-pay

## 2023-11-15 ENCOUNTER — Other Ambulatory Visit: Payer: Self-pay

## 2023-11-15 MED ORDER — HYDROCODONE-ACETAMINOPHEN 10-325 MG PO TABS
1.0000 | ORAL_TABLET | Freq: Three times a day (TID) | ORAL | 0 refills | Status: AC | PRN
  Filled 2023-11-15: qty 90, 30d supply, fill #0

## 2023-11-18 ENCOUNTER — Other Ambulatory Visit: Payer: Self-pay

## 2023-12-02 ENCOUNTER — Other Ambulatory Visit (HOSPITAL_COMMUNITY): Payer: Self-pay

## 2023-12-26 ENCOUNTER — Other Ambulatory Visit: Payer: Self-pay

## 2024-01-21 ENCOUNTER — Other Ambulatory Visit: Payer: Self-pay

## 2024-03-09 ENCOUNTER — Other Ambulatory Visit
Admission: RE | Admit: 2024-03-09 | Discharge: 2024-03-09 | Disposition: A | Discharge status: Home / Self Care | Source: Ambulatory Visit | Attending: Family Medicine | Admitting: Family Medicine

## 2024-03-09 DIAGNOSIS — R1032 Left lower quadrant pain: Secondary | ICD-10-CM | POA: Insufficient documentation

## 2024-03-09 DIAGNOSIS — M25552 Pain in left hip: Secondary | ICD-10-CM | POA: Diagnosis present

## 2024-03-09 LAB — D-DIMER, QUANTITATIVE: D-Dimer, Quant: <0.27 ug{FEU}/mL (ref 0.00–0.50)

## 2024-03-11 ENCOUNTER — Other Ambulatory Visit: Payer: Self-pay

## 2024-03-11 ENCOUNTER — Other Ambulatory Visit: Payer: Self-pay | Admitting: Family Medicine

## 2024-03-11 DIAGNOSIS — M25552 Pain in left hip: Secondary | ICD-10-CM

## 2024-03-11 DIAGNOSIS — S76012A Strain of muscle, fascia and tendon of left hip, initial encounter: Secondary | ICD-10-CM

## 2024-03-11 DIAGNOSIS — M1612 Unilateral primary osteoarthritis, left hip: Secondary | ICD-10-CM

## 2024-03-11 MED ORDER — HYDROCODONE-ACETAMINOPHEN 10-325 MG PO TABS
1.0000 | ORAL_TABLET | Freq: Four times a day (QID) | ORAL | 0 refills | Status: AC | PRN
  Filled 2024-03-11: qty 120, 30d supply, fill #0

## 2024-03-12 ENCOUNTER — Ambulatory Visit
Admission: RE | Admit: 2024-03-12 | Discharge: 2024-03-12 | Disposition: A | Discharge status: Home / Self Care | Source: Ambulatory Visit | Attending: Family Medicine | Admitting: Family Medicine

## 2024-03-12 DIAGNOSIS — M1612 Unilateral primary osteoarthritis, left hip: Secondary | ICD-10-CM

## 2024-03-12 DIAGNOSIS — S76012A Strain of muscle, fascia and tendon of left hip, initial encounter: Secondary | ICD-10-CM

## 2024-03-12 DIAGNOSIS — M25552 Pain in left hip: Secondary | ICD-10-CM

## 2024-03-23 ENCOUNTER — Other Ambulatory Visit: Payer: Self-pay

## 2024-03-24 ENCOUNTER — Other Ambulatory Visit: Payer: Self-pay

## 2024-03-24 MED ORDER — JANUVIA 100 MG PO TABS
100.0000 mg | ORAL_TABLET | Freq: Every day | ORAL | 11 refills | Status: DC
  Filled 2024-03-24: qty 30, 30d supply, fill #0

## 2024-03-25 ENCOUNTER — Other Ambulatory Visit: Payer: Self-pay

## 2024-04-02 ENCOUNTER — Other Ambulatory Visit: Payer: Self-pay

## 2024-04-02 MED ORDER — RYBELSUS 3 MG PO TABS
3.0000 mg | ORAL_TABLET | Freq: Every day | ORAL | 5 refills | Status: DC
  Filled 2024-04-02 – 2024-04-13 (×2): qty 30, 30d supply, fill #0
  Filled 2024-05-05: qty 30, 30d supply, fill #1
  Filled 2024-05-13: qty 90, 90d supply, fill #1

## 2024-04-13 ENCOUNTER — Other Ambulatory Visit: Payer: Self-pay

## 2024-04-15 ENCOUNTER — Other Ambulatory Visit: Payer: Self-pay

## 2024-04-15 MED ORDER — PREDNISONE 10 MG PO TABS
ORAL_TABLET | ORAL | 0 refills | Status: DC
  Filled 2024-04-15: qty 21, 6d supply, fill #0

## 2024-04-15 MED ORDER — PSEUDOEPHEDRINE-GUAIFENESIN ER 60-600 MG PO TB12
ORAL_TABLET | ORAL | 0 refills | Status: AC
  Filled 2024-04-15: qty 18, 9d supply, fill #0

## 2024-04-15 MED ORDER — LEVOFLOXACIN 500 MG PO TABS
500.0000 mg | ORAL_TABLET | Freq: Every day | ORAL | 0 refills | Status: AC
  Filled 2024-04-15: qty 10, 10d supply, fill #0

## 2024-05-06 ENCOUNTER — Other Ambulatory Visit: Payer: Self-pay

## 2024-05-06 ENCOUNTER — Other Ambulatory Visit (HOSPITAL_COMMUNITY): Payer: Self-pay

## 2024-05-07 ENCOUNTER — Other Ambulatory Visit (HOSPITAL_COMMUNITY): Payer: Self-pay

## 2024-05-08 ENCOUNTER — Other Ambulatory Visit: Payer: Self-pay

## 2024-05-08 ENCOUNTER — Other Ambulatory Visit (HOSPITAL_COMMUNITY): Payer: Self-pay

## 2024-05-08 ENCOUNTER — Encounter: Payer: Self-pay | Admitting: Pharmacist

## 2024-05-13 ENCOUNTER — Other Ambulatory Visit: Payer: Self-pay

## 2024-05-13 ENCOUNTER — Other Ambulatory Visit (HOSPITAL_COMMUNITY): Payer: Self-pay

## 2024-05-14 ENCOUNTER — Other Ambulatory Visit: Payer: Self-pay

## 2024-05-15 ENCOUNTER — Other Ambulatory Visit: Payer: Self-pay

## 2024-05-18 ENCOUNTER — Other Ambulatory Visit: Payer: Self-pay

## 2024-05-18 ENCOUNTER — Encounter: Payer: Self-pay | Admitting: Pharmacist

## 2024-05-18 ENCOUNTER — Other Ambulatory Visit (HOSPITAL_COMMUNITY): Payer: Self-pay

## 2024-06-16 ENCOUNTER — Other Ambulatory Visit: Payer: Self-pay

## 2024-06-16 MED ORDER — HYDROCODONE-ACETAMINOPHEN 10-325 MG PO TABS
1.0000 | ORAL_TABLET | Freq: Four times a day (QID) | ORAL | 0 refills | Status: AC | PRN
  Filled 2024-06-16: qty 120, 30d supply, fill #0

## 2024-07-10 ENCOUNTER — Other Ambulatory Visit: Payer: Self-pay

## 2024-07-11 ENCOUNTER — Other Ambulatory Visit: Payer: Self-pay

## 2024-07-15 ENCOUNTER — Other Ambulatory Visit: Payer: Self-pay

## 2024-07-15 MED ORDER — ESCITALOPRAM OXALATE 10 MG PO TABS
10.0000 mg | ORAL_TABLET | Freq: Every day | ORAL | 2 refills | Status: DC
  Filled 2024-07-15: qty 30, 30d supply, fill #0
  Filled 2024-08-10: qty 30, 30d supply, fill #1

## 2024-07-16 ENCOUNTER — Other Ambulatory Visit: Payer: Self-pay

## 2024-07-16 MED ORDER — ERYTHROMYCIN 5 MG/GM OP OINT
TOPICAL_OINTMENT | Freq: Three times a day (TID) | OPHTHALMIC | 0 refills | Status: AC
  Filled 2024-07-16: qty 3.5, 10d supply, fill #0

## 2024-08-10 ENCOUNTER — Other Ambulatory Visit: Payer: Self-pay

## 2024-08-10 MED ORDER — NEOMYCIN-POLYMYXIN-DEXAMETH 3.5-10000-0.1 OP SUSP
1.0000 [drp] | Freq: Two times a day (BID) | OPHTHALMIC | 0 refills | Status: AC
  Filled 2024-08-10: qty 5, 50d supply, fill #0

## 2024-08-11 ENCOUNTER — Other Ambulatory Visit: Payer: Self-pay

## 2024-08-13 ENCOUNTER — Other Ambulatory Visit: Payer: Self-pay

## 2024-08-13 MED ORDER — RYBELSUS 3 MG PO TABS
3.0000 mg | ORAL_TABLET | Freq: Every day | ORAL | 3 refills | Status: DC
  Filled 2024-08-13: qty 90, 90d supply, fill #0

## 2024-08-13 MED ORDER — FREESTYLE LIBRE 3 PLUS SENSOR MISC
3 refills | Status: AC
  Filled 2024-08-13: qty 6, 90d supply, fill #0

## 2024-08-24 ENCOUNTER — Other Ambulatory Visit: Payer: Self-pay

## 2024-08-24 MED ORDER — PAROXETINE HCL 10 MG PO TABS
10.0000 mg | ORAL_TABLET | Freq: Every day | ORAL | 11 refills | Status: AC
  Filled 2024-08-24: qty 30, 30d supply, fill #0

## 2024-08-24 MED ORDER — RYBELSUS 7 MG PO TABS
7.0000 mg | ORAL_TABLET | Freq: Every day | ORAL | 1 refills | Status: DC
  Filled 2024-08-24: qty 90, 90d supply, fill #0

## 2024-09-11 ENCOUNTER — Other Ambulatory Visit: Payer: Self-pay

## 2024-09-11 MED ORDER — RYBELSUS 3 MG PO TABS
3.0000 mg | ORAL_TABLET | Freq: Every day | ORAL | 1 refills | Status: AC
  Filled 2024-09-11: qty 90, 90d supply, fill #0
